# Patient Record
Sex: Male | Born: 1990 | Race: Black or African American | Hispanic: No | Marital: Married | Smoking: Never smoker
Health system: Southern US, Community
[De-identification: ages and names within clinical notes are randomized; demographics above are authoritative.]

## PROBLEM LIST (undated history)

## (undated) DIAGNOSIS — J45909 Unspecified asthma, uncomplicated: Secondary | ICD-10-CM

## (undated) HISTORY — PX: TYMPANOSTOMY TUBE PLACEMENT: SHX32

---

## 2014-05-02 ENCOUNTER — Observation Stay (HOSPITAL_COMMUNITY)
Admission: EM | Admit: 2014-05-02 | Discharge: 2014-05-03 | Disposition: A | Discharge status: Home / Self Care | Payer: BLUE CROSS/BLUE SHIELD | Attending: General Surgery | Admitting: General Surgery

## 2014-05-02 ENCOUNTER — Emergency Department (HOSPITAL_COMMUNITY): Payer: BLUE CROSS/BLUE SHIELD

## 2014-05-02 ENCOUNTER — Encounter (HOSPITAL_COMMUNITY): Payer: Self-pay | Admitting: Emergency Medicine

## 2014-05-02 DIAGNOSIS — S0512XA Contusion of eyeball and orbital tissues, left eye, initial encounter: Principal | ICD-10-CM | POA: Insufficient documentation

## 2014-05-02 DIAGNOSIS — J45909 Unspecified asthma, uncomplicated: Secondary | ICD-10-CM | POA: Insufficient documentation

## 2014-05-02 DIAGNOSIS — Y92410 Unspecified street and highway as the place of occurrence of the external cause: Secondary | ICD-10-CM | POA: Diagnosis not present

## 2014-05-02 DIAGNOSIS — S0510XA Contusion of eyeball and orbital tissues, unspecified eye, initial encounter: Secondary | ICD-10-CM | POA: Diagnosis present

## 2014-05-02 DIAGNOSIS — W2211XA Striking against or struck by driver side automobile airbag, initial encounter: Secondary | ICD-10-CM | POA: Insufficient documentation

## 2014-05-02 DIAGNOSIS — S0993XA Unspecified injury of face, initial encounter: Secondary | ICD-10-CM

## 2014-05-02 DIAGNOSIS — S058X9A Other injuries of unspecified eye and orbit, initial encounter: Secondary | ICD-10-CM | POA: Diagnosis present

## 2014-05-02 DIAGNOSIS — Z881 Allergy status to other antibiotic agents status: Secondary | ICD-10-CM | POA: Insufficient documentation

## 2014-05-02 DIAGNOSIS — S0511XA Contusion of eyeball and orbital tissues, right eye, initial encounter: Secondary | ICD-10-CM | POA: Diagnosis not present

## 2014-05-02 DIAGNOSIS — S0081XA Abrasion of other part of head, initial encounter: Secondary | ICD-10-CM | POA: Diagnosis present

## 2014-05-02 DIAGNOSIS — T1490XA Injury, unspecified, initial encounter: Secondary | ICD-10-CM

## 2014-05-02 DIAGNOSIS — S058X1A Other injuries of right eye and orbit, initial encounter: Secondary | ICD-10-CM | POA: Insufficient documentation

## 2014-05-02 DIAGNOSIS — S0091XA Abrasion of unspecified part of head, initial encounter: Secondary | ICD-10-CM | POA: Diagnosis not present

## 2014-05-02 DIAGNOSIS — H02843 Edema of right eye, unspecified eyelid: Secondary | ICD-10-CM

## 2014-05-02 DIAGNOSIS — H02846 Edema of left eye, unspecified eyelid: Secondary | ICD-10-CM

## 2014-05-02 HISTORY — DX: Unspecified asthma, uncomplicated: J45.909

## 2014-05-02 MED ORDER — FLUORESCEIN SODIUM 1 MG OP STRP
2.0000 | ORAL_STRIP | Freq: Once | OPHTHALMIC | Status: DC
  Filled 2014-05-02: qty 2

## 2014-05-02 MED ORDER — TETRACAINE HCL 0.5 % OP SOLN: 2.0000 [drp] | Freq: Once | OPHTHALMIC | Status: DC

## 2014-05-02 MED ORDER — TETANUS-DIPHTH-ACELL PERTUSSIS 5-2.5-18.5 LF-MCG/0.5 IM SUSP
0.5000 mL | Freq: Once | INTRAMUSCULAR | Status: DC
  Filled 2014-05-02: qty 0.5

## 2014-05-02 MED ORDER — TETRACAINE HCL 0.5 % OP SOLN
1.0000 [drp] | Freq: Once | OPHTHALMIC | Status: DC
  Filled 2014-05-02: qty 2
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
## 2014-05-03 ENCOUNTER — Observation Stay (HOSPITAL_COMMUNITY): Payer: BLUE CROSS/BLUE SHIELD

## 2014-05-03 DIAGNOSIS — S0510XA Contusion of eyeball and orbital tissues, unspecified eye, initial encounter: Secondary | ICD-10-CM | POA: Diagnosis present

## 2014-05-03 DIAGNOSIS — S058X9A Other injuries of unspecified eye and orbit, initial encounter: Secondary | ICD-10-CM | POA: Diagnosis present

## 2014-05-03 LAB — BASIC METABOLIC PANEL
Anion gap: 4 — ABNORMAL LOW (ref 5–15)
BUN: 9 mg/dL (ref 6–23)
CO2: 29 mmol/L (ref 19–32)
Calcium: 8.9 mg/dL (ref 8.4–10.5)
Chloride: 101 mmol/L (ref 96–112)
Creatinine, Ser: 0.96 mg/dL (ref 0.50–1.35)
Glucose, Bld: 121 mg/dL — ABNORMAL HIGH (ref 70–99)
POTASSIUM: 4 mmol/L (ref 3.5–5.1)
Sodium: 134 mmol/L — ABNORMAL LOW (ref 135–145)

## 2014-05-03 LAB — CBC
HEMATOCRIT: 38.9 % — AB (ref 39.0–52.0)
HEMOGLOBIN: 12.9 g/dL — AB (ref 13.0–17.0)
MCH: 27.9 pg (ref 26.0–34.0)
MCHC: 33.2 g/dL (ref 30.0–36.0)
MCV: 84 fL (ref 78.0–100.0)
Platelets: 230 10*3/uL (ref 150–400)
RBC: 4.63 MIL/uL (ref 4.22–5.81)
RDW: 13.2 % (ref 11.5–15.5)
WBC: 8.3 10*3/uL (ref 4.0–10.5)

## 2014-05-03 MED ORDER — OXYCODONE HCL 5 MG PO TABS: 5.0000 mg | ORAL_TABLET | ORAL | Status: DC | PRN

## 2014-05-03 MED ORDER — ONDANSETRON HCL 4 MG/2ML IJ SOLN: 4.0000 mg | Freq: Four times a day (QID) | INTRAMUSCULAR | Status: DC | PRN

## 2014-05-03 MED ORDER — MORPHINE SULFATE 2 MG/ML IJ SOLN
2.0000 mg | INTRAMUSCULAR | Status: DC | PRN
  Administered 2014-05-03: 2 mg via INTRAVENOUS
  Filled 2014-05-03: qty 1

## 2014-05-03 MED ORDER — OXYCODONE HCL 5 MG PO TABS
10.0000 mg | ORAL_TABLET | ORAL | Status: DC | PRN
  Administered 2014-05-03 (×2): 10 mg via ORAL
  Filled 2014-05-03 (×2): qty 2

## 2014-05-03 MED ORDER — PREDNISOLONE ACETATE 1 % OP SUSP: 1.0000 [drp] | Freq: Four times a day (QID) | OPHTHALMIC | Status: DC

## 2014-05-03 MED ORDER — SODIUM CHLORIDE 0.9 % IV SOLN
INTRAVENOUS | Status: DC
  Administered 2014-05-03: 06:00:00 via INTRAVENOUS

## 2014-05-03 MED ORDER — ATROPINE SULFATE 1 % OP SOLN
1.0000 [drp] | Freq: Every day | OPHTHALMIC | Status: DC
  Administered 2014-05-03: 1 [drp] via OPHTHALMIC
  Filled 2014-05-03: qty 2

## 2014-05-03 MED ORDER — PREDNISOLONE ACETATE 1 % OP SUSP
1.0000 [drp] | Freq: Four times a day (QID) | OPHTHALMIC | Status: DC
  Administered 2014-05-03 (×2): 1 [drp] via OPHTHALMIC
  Filled 2014-05-03: qty 1

## 2014-05-03 MED ORDER — OXYCODONE-ACETAMINOPHEN 5-325 MG PO TABS: 1.0000 | ORAL_TABLET | ORAL | Status: DC | PRN

## 2014-05-03 MED ORDER — BACITRACIN-POLYMYXIN B 500-10000 UNIT/GM OP OINT: TOPICAL_OINTMENT | Freq: Two times a day (BID) | OPHTHALMIC | Status: DC

## 2014-05-03 MED ORDER — ATROPINE SULFATE 1 % OP SOLN: 1.0000 [drp] | Freq: Every day | OPHTHALMIC | Status: DC

## 2014-05-03 MED ORDER — ONDANSETRON HCL 4 MG PO TABS: 4.0000 mg | ORAL_TABLET | Freq: Four times a day (QID) | ORAL | Status: DC | PRN

## 2014-05-03 MED ORDER — MORPHINE SULFATE 4 MG/ML IJ SOLN: 4.0000 mg | INTRAMUSCULAR | Status: DC | PRN

## 2014-05-03 MED ORDER — MORPHINE SULFATE 2 MG/ML IJ SOLN: 1.0000 mg | INTRAMUSCULAR | Status: DC | PRN

## 2014-05-03 MED ORDER — BACITRACIN-POLYMYXIN B 500-10000 UNIT/GM OP OINT
TOPICAL_OINTMENT | Freq: Two times a day (BID) | OPHTHALMIC | Status: DC
  Administered 2014-05-03: 14:00:00 via OPHTHALMIC
  Filled 2014-05-03: qty 3.5

## 2014-05-03 MED ORDER — OXYCODONE HCL 5 MG PO TABS: 2.5000 mg | ORAL_TABLET | ORAL | Status: DC | PRN
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
## 2014-05-03 NOTE — ED Provider Notes (Signed)
I discussed case with opthamology(Dr. Randon GoldsmithLyles), and explained that I thought the patient should be seen by opthamology prior to discharge.  He stated he would see him in the hospital in the AM.  I then discussed the case with trauma(Dr. Rayburn MaBlackmon) who wrote admission orders.  Patient informed and agrees with plan.  Nelia Shiobert L Mykaylah Ballman, MD 05/03/14 [phone]

## 2014-05-03 NOTE — Progress Notes (Signed)
AVS discharge instructions were reviewed with patient and his family. Patient was given prescriptions for roxicet and three eyes drops. Patient and his family stated that they did not have any questions. Staff assisted patient to his transportation.

## 2014-05-03 NOTE — Progress Notes (Signed)
UR completed 
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
## 2014-05-04 LAB — SICKLE CELL SCREEN: SICKLE CELL SCREEN: NEGATIVE

## 2015-03-21 ENCOUNTER — Ambulatory Visit (INDEPENDENT_AMBULATORY_CARE_PROVIDER_SITE_OTHER): Payer: BLUE CROSS/BLUE SHIELD | Admitting: Physician Assistant

## 2015-03-21 VITALS — BP 122/74 | HR 89 | Temp 99.0°F | Resp 17 | Ht 69.0 in | Wt 169.0 lb

## 2015-03-21 DIAGNOSIS — H9201 Otalgia, right ear: Secondary | ICD-10-CM

## 2015-03-21 MED ORDER — AMOXICILLIN 875 MG PO TABS: 875.0000 mg | ORAL_TABLET | Freq: Two times a day (BID) | ORAL | Status: DC

## 2015-03-21 NOTE — Patient Instructions (Signed)

## 2015-03-21 NOTE — Progress Notes (Signed)
03/21/2015 1:50 PM   DOB: [birth year] / MRN: [id number]  SUBJECTIVE:  Patrick Jimenez is a 25 y.o. male presenting for right sided ear pain that started 6 days ago and is worsening.  He complains of a decrease in hearing on the affected side.  Denies fever and external ear pain. Reports his girlfriend stuck a safety pen in his ear and this helped his symptoms.     He is allergic to vantin.   He  has a past medical history of Asthma.    He  reports that he has never smoked. He does not have any smokeless tobacco history on file. He reports that he drinks alcohol. He reports that he does not use illicit drugs. He  has no sexual activity history on file. The patient  has no past surgical history on file.  His family history is not on file.  Review of Systems  Constitutional: Negative for fever and chills.  HENT: Positive for ear pain. Negative for congestion, ear discharge, sore throat and tinnitus.   Eyes: Negative for blurred vision.  Respiratory: Negative for cough and shortness of breath.   Cardiovascular: Negative for chest pain.  Gastrointestinal: Negative for nausea and abdominal pain.  Genitourinary: Negative for dysuria, urgency and frequency.  Musculoskeletal: Negative for myalgias.  Skin: Negative for rash.  Neurological: Negative for dizziness, tingling and headaches.  Psychiatric/Behavioral: Negative for depression. The patient is not nervous/anxious.     Problem list and medications reviewed and updated by myself where necessary, and exist elsewhere in the encounter.   OBJECTIVE:  BP 122/74 mmHg  Pulse 89  Temp(Src) 99 F (37.2 C) (Oral)  Resp 17  Ht 5\' 9"  (1.753 m)  Wt 169 lb (76.658 kg)  BMI 24.95 kg/m2  SpO2 98%  Physical Exam  Constitutional: He is oriented to person, place, and time. He appears well-developed. He does not appear ill.  HENT:  Right Ear: No tenderness. Tympanic membrane is injected and erythematous. Tympanic membrane is not perforated. Decreased  hearing is noted.  Left Ear: Tympanic membrane normal. No tenderness. No decreased hearing is noted.  Nose: Nose normal. No mucosal edema.  Mouth/Throat: Uvula is midline, oropharynx is clear and moist and mucous membranes are normal.  Weber lateralizes to the right ear.  A>B bilaterally.   Eyes: Conjunctivae and EOM are normal. Pupils are equal, round, and reactive to light.  Cardiovascular: Normal rate and regular rhythm.   Pulmonary/Chest: Effort normal and breath sounds normal.  Abdominal: He exhibits no distension.  Musculoskeletal: Normal range of motion.  Neurological: He is alert and oriented to person, place, and time. No cranial nerve deficit. Coordination normal.  Skin: Skin is warm and dry. He is not diaphoretic.  Psychiatric: He has a normal mood and affect.  Nursing note and vitals reviewed.   No results found for this or any previous visit (from the past 48 hour(s)).  ASSESSMENT AND PLAN  Hazem was seen today for ear infection.  Diagnoses and all orders for this visit:  Ear pain, right: Most likely otitis media.  Given that this has been present for 7 days now will treat empirically.  Advised he RTC the plan does not help his symptoms.  -     amoxicillin (AMOXIL) 875 MG tablet; Take 1 tablet (875 mg total) by mouth 2 (two) times daily.    The patient was advised to call or return to clinic if he does not see an improvement in symptoms or to seek the  care of the closest emergency department if he worsens with the above plan.   Deliah BostonMichael Jamion Carter, MHS, PA-C [facility] 03/21/2015 1:50 PM

## 2021-09-12 ENCOUNTER — Ambulatory Visit
Admission: RE | Admit: 2021-09-12 | Discharge: 2021-09-12 | Disposition: A | Discharge status: Home / Self Care | Payer: No Typology Code available for payment source | Source: Ambulatory Visit | Attending: Nurse Practitioner | Admitting: Nurse Practitioner

## 2021-09-12 ENCOUNTER — Other Ambulatory Visit: Payer: Self-pay | Admitting: Nurse Practitioner

## 2021-09-12 DIAGNOSIS — Z021 Encounter for pre-employment examination: Secondary | ICD-10-CM

## 2024-02-26 ENCOUNTER — Ambulatory Visit: Admission: EM | Admit: 2024-02-26 | Discharge: 2024-02-26 | Disposition: A | Discharge status: Home / Self Care

## 2024-02-26 DIAGNOSIS — H6591 Unspecified nonsuppurative otitis media, right ear: Secondary | ICD-10-CM | POA: Diagnosis not present

## 2024-02-26 MED ORDER — LEVOFLOXACIN 750 MG PO TABS: 750.0000 mg | ORAL_TABLET | Freq: Every day | ORAL | 0 refills | Status: AC
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
## 2024-02-26 NOTE — ED Triage Notes (Addendum)
 Patient states for 2-3 days he has been having right ear fullness, muffled hearing, and brown discharge.  No attempted interventions.

## 2024-02-26 NOTE — Discharge Instructions (Signed)
 You appear to have otitis media with effusion in your right ear at this time.  Typically, this does not require antibiotic treatment but because of the unusual color of the fluid in your right ear, and your history of frequent ear infections, I recommend that we treat you aggressively with a strong antibiotic called levofloxacin which covers a very broad-spectrum of bacteria that causes upper respiratory tract infections.  If this sensation of fullness in your right ear does not resolve after you have completed levofloxacin, please follow-up with an ear nose and throat specialist for further evaluation and treatment.  Thank you for visiting [facility] today.
